# Patient Record
Sex: Female | Born: 1980 | Race: White | Hispanic: No | Marital: Married | Smoking: Current every day smoker
Health system: Southern US, Community
[De-identification: ages and names within clinical notes are randomized; demographics above are authoritative.]

## PROBLEM LIST (undated history)

## (undated) DIAGNOSIS — F419 Anxiety disorder, unspecified: Secondary | ICD-10-CM

## (undated) DIAGNOSIS — F329 Major depressive disorder, single episode, unspecified: Secondary | ICD-10-CM

## (undated) DIAGNOSIS — F32A Depression, unspecified: Secondary | ICD-10-CM

## (undated) DIAGNOSIS — F4000 Agoraphobia, unspecified: Secondary | ICD-10-CM

---

## 2016-06-04 ENCOUNTER — Emergency Department (HOSPITAL_BASED_OUTPATIENT_CLINIC_OR_DEPARTMENT_OTHER): Payer: Self-pay

## 2016-06-04 ENCOUNTER — Emergency Department (HOSPITAL_BASED_OUTPATIENT_CLINIC_OR_DEPARTMENT_OTHER)
Admission: EM | Admit: 2016-06-04 | Discharge: 2016-06-04 | Disposition: A | Discharge status: Home / Self Care | Payer: Self-pay | Attending: Emergency Medicine | Admitting: Emergency Medicine

## 2016-06-04 ENCOUNTER — Encounter (HOSPITAL_BASED_OUTPATIENT_CLINIC_OR_DEPARTMENT_OTHER): Payer: Self-pay | Admitting: Emergency Medicine

## 2016-06-04 DIAGNOSIS — R05 Cough: Secondary | ICD-10-CM | POA: Insufficient documentation

## 2016-06-04 DIAGNOSIS — R350 Frequency of micturition: Secondary | ICD-10-CM | POA: Insufficient documentation

## 2016-06-04 DIAGNOSIS — R1084 Generalized abdominal pain: Secondary | ICD-10-CM

## 2016-06-04 DIAGNOSIS — R197 Diarrhea, unspecified: Secondary | ICD-10-CM | POA: Insufficient documentation

## 2016-06-04 DIAGNOSIS — K429 Umbilical hernia without obstruction or gangrene: Secondary | ICD-10-CM | POA: Insufficient documentation

## 2016-06-04 DIAGNOSIS — F17200 Nicotine dependence, unspecified, uncomplicated: Secondary | ICD-10-CM | POA: Insufficient documentation

## 2016-06-04 DIAGNOSIS — R0781 Pleurodynia: Secondary | ICD-10-CM | POA: Insufficient documentation

## 2016-06-04 HISTORY — DX: Depression, unspecified: F32.A

## 2016-06-04 HISTORY — DX: Major depressive disorder, single episode, unspecified: F32.9

## 2016-06-04 HISTORY — DX: Agoraphobia, unspecified: F40.00

## 2016-06-04 HISTORY — DX: Anxiety disorder, unspecified: F41.9

## 2016-06-04 LAB — CBC WITH DIFFERENTIAL/PLATELET
BASOS ABS: 0.1 10*3/uL (ref 0.0–0.1)
Basophils Relative: 1 %
EOS PCT: 1 %
Eosinophils Absolute: 0.1 10*3/uL (ref 0.0–0.7)
HCT: 42.8 % (ref 36.0–46.0)
Hemoglobin: 14.5 g/dL (ref 12.0–15.0)
LYMPHS PCT: 23 %
Lymphs Abs: 2.3 10*3/uL (ref 0.7–4.0)
MCH: 30.5 pg (ref 26.0–34.0)
MCHC: 33.9 g/dL (ref 30.0–36.0)
MCV: 90.1 fL (ref 78.0–100.0)
Monocytes Absolute: 0.7 10*3/uL (ref 0.1–1.0)
Monocytes Relative: 6 %
NEUTROS ABS: 7.1 10*3/uL (ref 1.7–7.7)
Neutrophils Relative %: 69 %
PLATELETS: 290 10*3/uL (ref 150–400)
RBC: 4.75 MIL/uL (ref 3.87–5.11)
RDW: 14.1 % (ref 11.5–15.5)
WBC: 10.2 10*3/uL (ref 4.0–10.5)

## 2016-06-04 LAB — LIPASE, BLOOD: LIPASE: 12 U/L (ref 11–51)

## 2016-06-04 LAB — COMPREHENSIVE METABOLIC PANEL
ALT: 13 U/L — ABNORMAL LOW (ref 14–54)
AST: 17 U/L (ref 15–41)
Albumin: 4.2 g/dL (ref 3.5–5.0)
Alkaline Phosphatase: 59 U/L (ref 38–126)
Anion gap: 9 (ref 5–15)
BUN: 8 mg/dL (ref 6–20)
CHLORIDE: 106 mmol/L (ref 101–111)
CO2: 23 mmol/L (ref 22–32)
CREATININE: 0.7 mg/dL (ref 0.44–1.00)
Calcium: 8.9 mg/dL (ref 8.9–10.3)
Glucose, Bld: 92 mg/dL (ref 65–99)
POTASSIUM: 3.7 mmol/L (ref 3.5–5.1)
Sodium: 138 mmol/L (ref 135–145)
Total Bilirubin: 0.3 mg/dL (ref 0.3–1.2)
Total Protein: 7.6 g/dL (ref 6.5–8.1)

## 2016-06-04 LAB — PREGNANCY, URINE: PREG TEST UR: NEGATIVE

## 2016-06-04 LAB — URINALYSIS, ROUTINE W REFLEX MICROSCOPIC
BILIRUBIN URINE: NEGATIVE
GLUCOSE, UA: NEGATIVE mg/dL
HGB URINE DIPSTICK: NEGATIVE
KETONES UR: NEGATIVE mg/dL
Leukocytes, UA: NEGATIVE
Nitrite: NEGATIVE
PROTEIN: NEGATIVE mg/dL
Specific Gravity, Urine: 1.022 (ref 1.005–1.030)
pH: 5.5 (ref 5.0–8.0)

## 2016-06-04 MED ORDER — ONDANSETRON 4 MG PO TBDP: 4.0000 mg | ORAL_TABLET | Freq: Three times a day (TID) | ORAL | 0 refills | Status: AC | PRN

## 2016-06-04 MED ORDER — ONDANSETRON HCL 4 MG/2ML IJ SOLN
4.0000 mg | Freq: Once | INTRAMUSCULAR | Status: AC
Start: 2016-06-04 — End: 2016-06-04
  Administered 2016-06-04: 4 mg via INTRAVENOUS
  Filled 2016-06-04: qty 2

## 2016-06-04 MED ORDER — SODIUM CHLORIDE 0.9 % IV BOLUS (SEPSIS)
1000.0000 mL | Freq: Once | INTRAVENOUS | Status: AC
  Administered 2016-06-04: 1000 mL via INTRAVENOUS

## 2016-06-04 MED ORDER — HYDROMORPHONE HCL 1 MG/ML IJ SOLN
0.5000 mg | Freq: Once | INTRAMUSCULAR | Status: AC
  Administered 2016-06-04: 0.5 mg via INTRAVENOUS
  Filled 2016-06-04: qty 1

## 2016-06-04 MED ORDER — IOPAMIDOL (ISOVUE-300) INJECTION 61%
100.0000 mL | Freq: Once | INTRAVENOUS | Status: AC | PRN
  Administered 2016-06-04: 100 mL via INTRAVENOUS

## 2016-06-04 NOTE — ED Notes (Signed)
Patient transported to CT 

## 2016-06-04 NOTE — ED Triage Notes (Signed)
Patient reports that she is having generalized pain  - patient reports that she was dx with gallstone 2 months ago. Patient reports that N/V/D

## 2016-06-04 NOTE — ED Provider Notes (Signed)
[facility]   CSN: [id number] Arrival date & time: 06/04/16  1938   By signing my name below, I, Regina Dyer, attest that this documentation has been prepared under the direction and in the presence of Pricilla Loveless, MD. Electronically Signed: Valentino Dyer, ED Scribe. 06/04/16. 8:21 PM.  History   Chief Complaint Chief Complaint  Patient presents with  . Abdominal Pain   The history is provided by the patient. No language interpreter was used.   HPI Comments: Regina Dyer is a 35 y.o. female with Hx of anxiety and depression, who presents to the Emergency Department complaining of gradually worsening upper abdominal pain onset two months ago. Pt states she was seen this summer for what she believed was a hernia. Per Pt, she was diagnosed with gallstones. She reports associated diarrhea, urine frequency, fatigue, chills, nausea, intermittent emesis occurring almost everyday. She notes her last episode was this afternoon after work. Pt states her upper abdominal pain is worsened after eating. Pt reports taking imodium and Pepto bismol for abdominal pain and diarrhea with minimal relief. She notes she is having episodal diarrhea 2-3 times per day. She also notes that her urine frequency has increased to the point where she is going on herself. Pt also reports upper back pain with bilateral shoulder pain. No alleviating factors noted. She also reports associated bronchitis that began two months ago accompanied with by a constant, mild, cough that has not been resolved. Pt notes she is feeling "like she is dying and her body is giving out". She denies dysuria, hematuria, CP, SOB. No additional complaints at this time. NKDA,    Past Medical History:  Diagnosis Date  . Agoraphobia   . Anxiety   . Depression     There are no active problems to display for this patient.   History reviewed. No pertinent surgical history.  OB History    No data  available       Home Medications    Prior to Admission medications   Medication Sig Start Date End Date Taking? Authorizing Provider  citalopram (CELEXA) 40 MG tablet Take 40 mg by mouth daily.   Yes Historical Provider, MD  clonazePAM (KLONOPIN) 1 MG tablet Take 1 mg by mouth 2 (two) times daily.   Yes Historical Provider, MD  ondansetron (ZOFRAN ODT) 4 MG disintegrating tablet Take 1 tablet (4 mg total) by mouth every 8 (eight) hours as needed for nausea or vomiting. 06/04/16   Pricilla Loveless, MD    Family History History reviewed. No pertinent family history.  Social History Social History  Substance Use Topics  . Smoking status: Current Every Day Smoker  . Smokeless tobacco: Never Used  . Alcohol use No     Allergies   Patient has no known allergies.   Review of Systems Review of Systems  Constitutional: Positive for chills and fatigue.  Respiratory: Positive for cough. Negative for shortness of breath.   Cardiovascular: Negative for chest pain.  Gastrointestinal: Positive for abdominal pain, diarrhea, nausea and vomiting.  Genitourinary: Positive for frequency. Negative for dysuria and hematuria.  Musculoskeletal: Positive for back pain (upper).  All other systems reviewed and are negative.    Physical Exam Updated Vital Signs BP 103/55 (BP Location: Right Arm)   Pulse 76   Temp 98.3 F (36.8 C) (Oral)   Resp 18   Ht 5' (1.524 m)   Wt 164 lb 9.6 oz (74.7 kg)   LMP  (LMP Unknown) Comment: depo  SpO2 98%   BMI 32.15 kg/m   Physical Exam  Constitutional: She is oriented to person, place, and time. She appears well-developed and well-nourished.  HENT:  Head: Normocephalic and atraumatic.  Right Ear: External ear normal.  Left Ear: External ear normal.  Nose: Nose normal.  Eyes: Right eye exhibits no discharge. Left eye exhibits no discharge.  Cardiovascular: Normal rate, regular rhythm and normal heart sounds.   Pulmonary/Chest: Effort normal and  breath sounds normal. She exhibits tenderness (point tnednerness to right lower anterior ribs ).  Abdominal: Soft. There is tenderness.  Soft, mild diffused tenderness. No CVA tenderness.   Neurological: She is alert and oriented to person, place, and time.  Skin: Skin is warm and dry.  Nursing note and vitals reviewed.    ED Treatments / Results   DIAGNOSTIC STUDIES: Oxygen Saturation is 100% on RA, normal by my interpretation.    COORDINATION OF CARE: 8:09 PM Discussed treatment plan with pt at bedside which includes abdomen pelvis with contrast Ct,labs, Zofran and Dilaudid pain and nausea medication and pt agreed to plan.   Labs (all labs ordered are listed, but only abnormal results are displayed) Labs Reviewed  URINALYSIS, ROUTINE W REFLEX MICROSCOPIC - Abnormal; Notable for the following:       Result Value   APPearance CLOUDY (*)    All other components within normal limits  COMPREHENSIVE METABOLIC PANEL - Abnormal; Notable for the following:    ALT 13 (*)    All other components within normal limits  PREGNANCY, URINE  LIPASE, BLOOD  CBC WITH DIFFERENTIAL/PLATELET    EKG  EKG Interpretation None       Radiology Dg Chest 2 View  Result Date: 06/04/2016 CLINICAL DATA:  Cough for 2 months. Recent bronchitis. Possible fever. History of smoking. EXAM: CHEST  2 VIEW COMPARISON:  None. FINDINGS: The heart size and mediastinal contours are within normal limits. Both lungs are clear. The visualized skeletal structures are unremarkable. IMPRESSION: No active cardiopulmonary disease. Electronically Signed   By: Norva PavlovElizabeth  Brown M.D.   On: 06/04/2016 20:42   Ct Abdomen Pelvis W Contrast  Result Date: 06/04/2016 CLINICAL DATA:  Upper abdominal pain/back pain for 2 months with diarrhea. EXAM: CT ABDOMEN AND PELVIS WITH CONTRAST TECHNIQUE: Multidetector CT imaging of the abdomen and pelvis was performed using the standard protocol following bolus administration of intravenous  contrast. CONTRAST:  100mL ISOVUE-300 IOPAMIDOL (ISOVUE-300) INJECTION 61% COMPARISON:  None. FINDINGS: Lower chest: No significant pulmonary nodules or acute consolidative airspace disease. Hepatobiliary: Normal liver with no liver mass. Normal gallbladder with no radiopaque cholelithiasis. No biliary ductal dilatation. Pancreas: Normal, with no mass or duct dilation. Spleen: Normal size. No mass. Adrenals/Urinary Tract: Normal adrenals. Normal kidneys with no hydronephrosis and no renal mass. Normal bladder. Stomach/Bowel: Grossly normal stomach. Normal caliber small bowel with no small bowel wall thickening. Normal appendix . Normal large bowel with no diverticulosis, large bowel wall thickening or pericolonic fat stranding. Vascular/Lymphatic: Normal caliber abdominal aorta. Patent portal, splenic and renal veins. No pathologically enlarged lymph nodes in the abdomen or pelvis. Reproductive: Grossly normal uterus.  No adnexal mass. Other: No pneumoperitoneum, ascites or focal fluid collection. Small to moderate fat containing umbilical hernia with hernia neck measuring 2.0 cm diameter. Minimal fat stranding within and surrounding the umbilical hernia. Musculoskeletal: No aggressive appearing focal osseous lesions. Minimal thoracolumbar spondylosis. IMPRESSION: 1. Small to moderate fat containing umbilical hernia. Minimal fat stranding within and surrounding the umbilical hernia. No fluid collections. 2.  Otherwise no acute abnormality in the abdomen or pelvis. No evidence of bowel obstruction or acute bowel inflammation. Electronically Signed   By: Delbert PhenixJason A Poff M.D.   On: 06/04/2016 22:00    Procedures Procedures (including critical care time)  Medications Ordered in ED Medications  HYDROmorphone (DILAUDID) injection 0.5 mg (0.5 mg Intravenous Given 06/04/16 2026)  ondansetron (ZOFRAN) injection 4 mg (4 mg Intravenous Given 06/04/16 2026)  sodium chloride 0.9 % bolus 1,000 mL (0 mLs Intravenous Stopped  06/04/16 2138)  iopamidol (ISOVUE-300) 61 % injection 100 mL (100 mLs Intravenous Contrast Given 06/04/16 2131)     Initial Impression / Assessment and Plan / ED Course  I have reviewed the triage vital signs and the nursing notes.  Pertinent labs & imaging results that were available during my care of the patient were reviewed by me and considered in my medical decision making (see chart for details).  Clinical Course as of Jun 04 2332  Sat Jun 04, 2016  2010 Will get labs, CT (diffuse pain/tenderness, non-focal) and IV pain/nausea meds. This is probably less likely gallbladder as her pain is multiple and diffuse. No u/s at this facility at this time  [SG]    Clinical Course User Index [SG] Pricilla LovelessScott Tahesha Skeet, MD    Patient with multiple complaints, no obvious etiology. Labs are unremarkable including hemoglobin and LFTs. Not pregnant. CT shows no acute pathology, does show a small umbilical hernia that does not appear to have bowel or signs of incarceration. No vomiting in the ED. At this point I do not find any emergent findings it would warrant further treatment or care in the ED. No need for an emergent surgical consult. Follow-up with surgery as an outpatient for elective hernia repair. No gallstones seen on CT although this is not the ideal test but my suspicion for cholecystitis is quite low. Discussed return precautions  Final Clinical Impressions(s) / ED Diagnoses   Final diagnoses:  Generalized abdominal pain  Umbilical hernia without obstruction and without gangrene    New Prescriptions Discharge Medication List as of 06/04/2016 11:06 PM    START taking these medications   Details  ondansetron (ZOFRAN ODT) 4 MG disintegrating tablet Take 1 tablet (4 mg total) by mouth every 8 (eight) hours as needed for nausea or vomiting., Starting Sat 06/04/2016, Print        I personally performed the services described in this documentation, which was scribed in my presence. The recorded  information has been reviewed and is accurate.     Pricilla LovelessScott Ellaree Gear, MD 06/04/16 [phone]

## 2017-06-16 IMAGING — CT CT ABD-PELV W/ CM
2 of 4 series · 16 of 46 positions shown, 18 images · IV contrast (iopamidol)
Comparison: None.

CLINICAL DATA: Upper abdominal pain/back pain for 2 months with
diarrhea.

EXAM:
CT ABDOMEN AND PELVIS WITH CONTRAST
TECHNIQUE: Multidetector CT imaging of the abdomen and pelvis was performed
using the standard protocol following bolus administration of
intravenous contrast.
CONTRAST:  100mL [id number] IOPAMIDOL (MD4ERU-YWW) INJECTION 61%

[Series 2: axial st · axial · 0.98mm/px · z∈[-443,-18]mm · 13 of 93 slices shown, 15 images]
[im 4/93  soft-tissue]
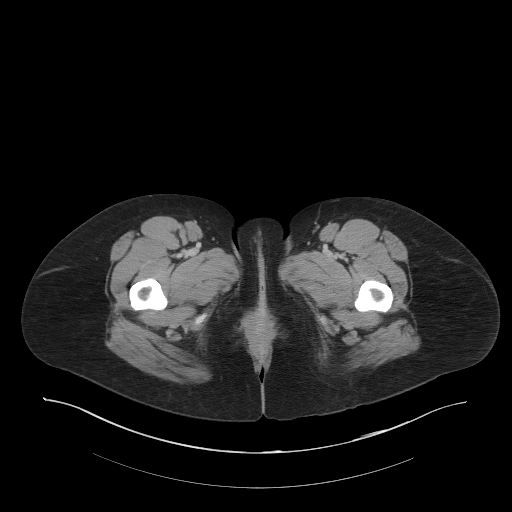
[im 4/93  bone]
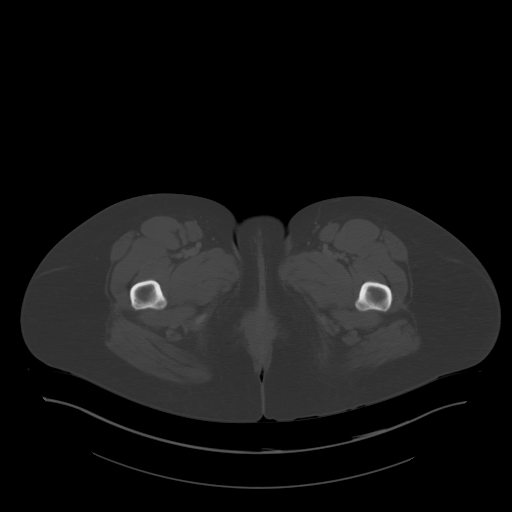
[im 12/93  soft-tissue]
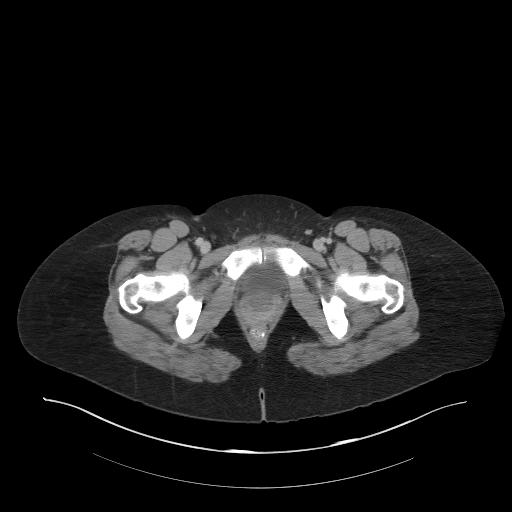
[im 20/93  soft-tissue]
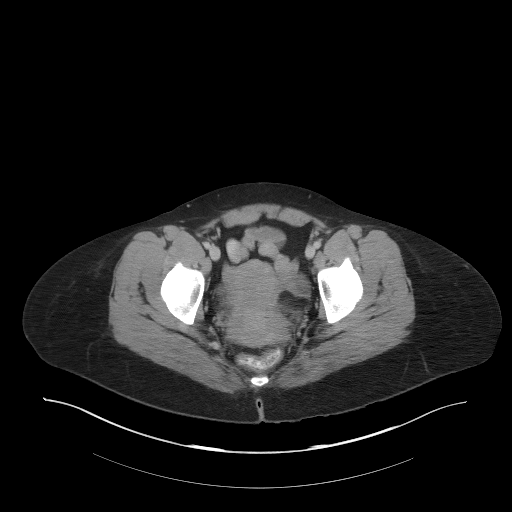
[im 27/93  soft-tissue]
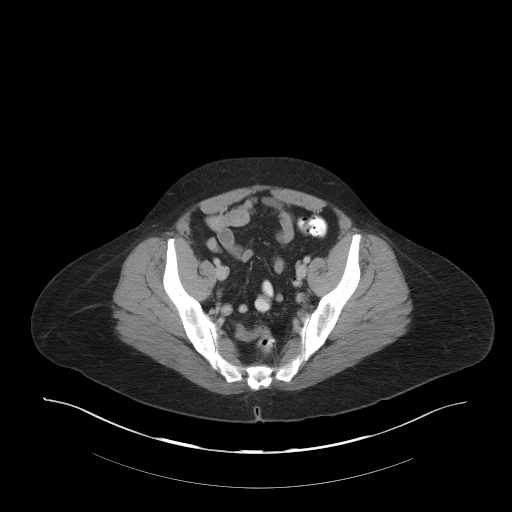
[im 31/93  soft-tissue]
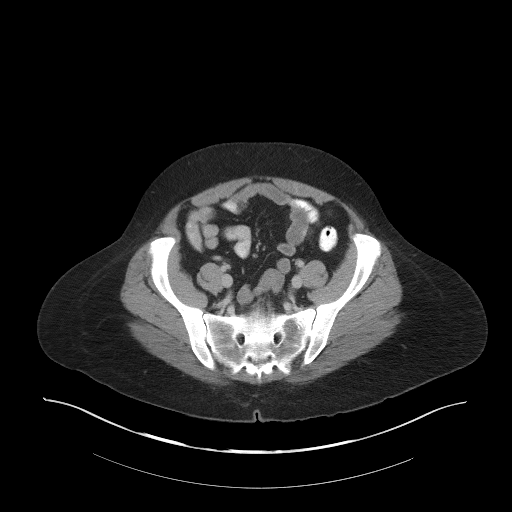
[im 39/93  soft-tissue]
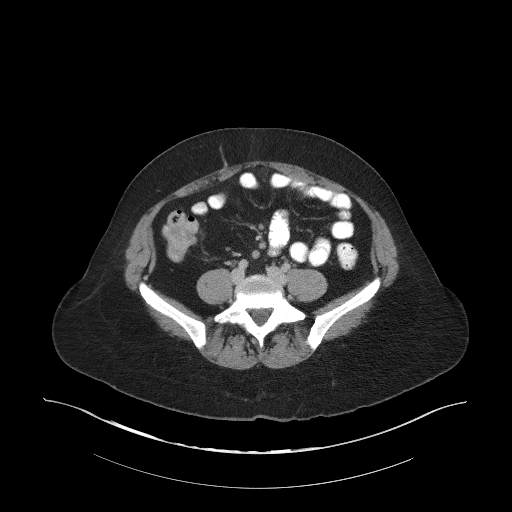
[im 47/93  soft-tissue]
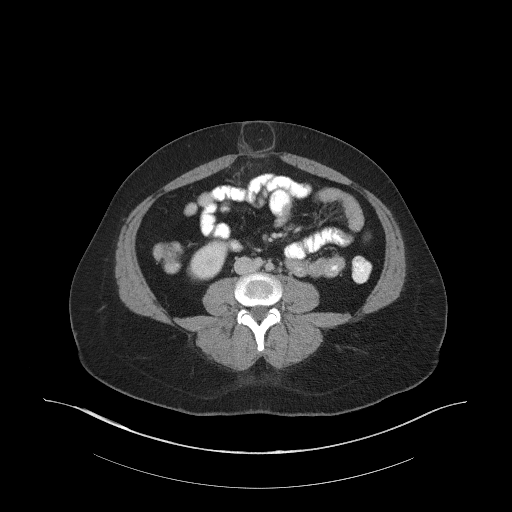
[im 54/93  soft-tissue]
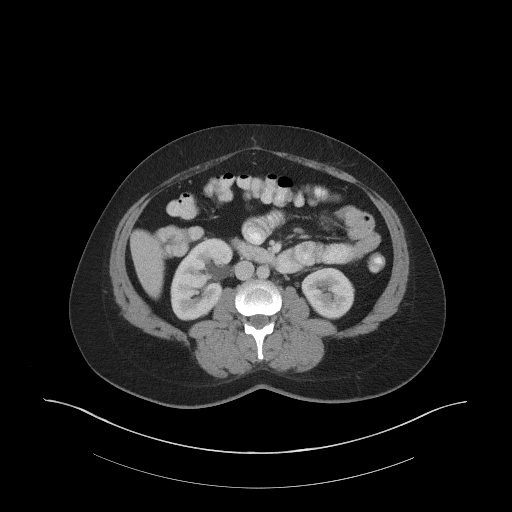
[im 62/93  soft-tissue]
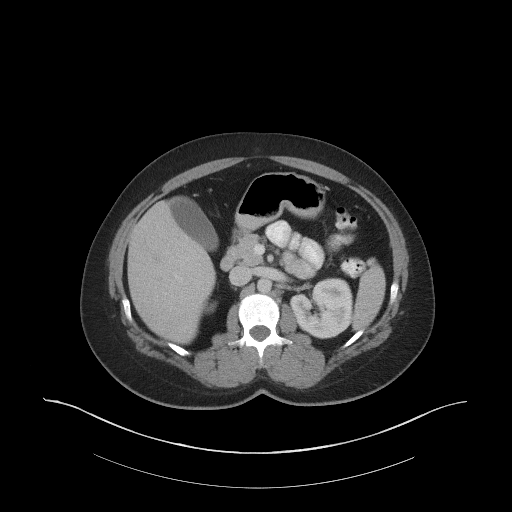
[im 62/93  bone]
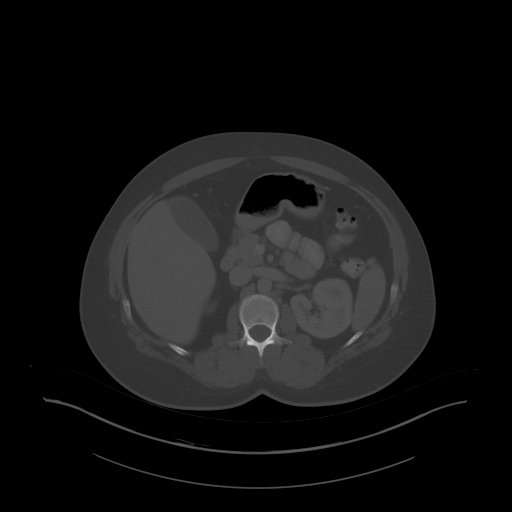
[im 66/93  soft-tissue]
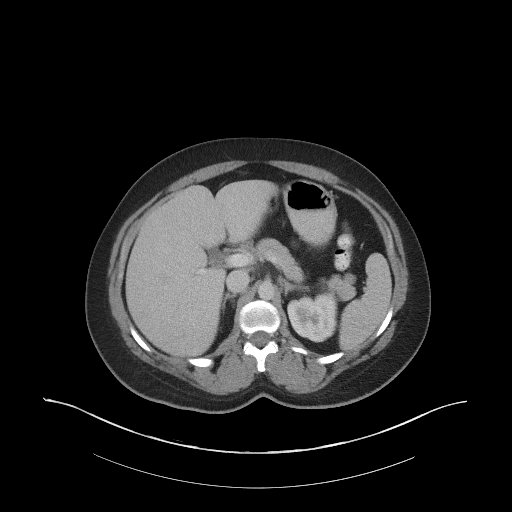
[im 73/93  soft-tissue]
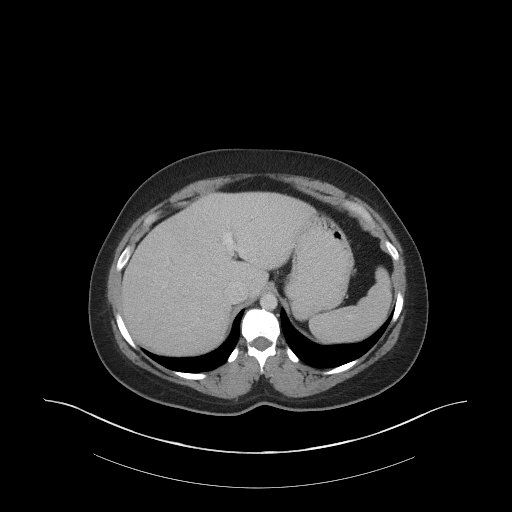
[im 81/93  soft-tissue]
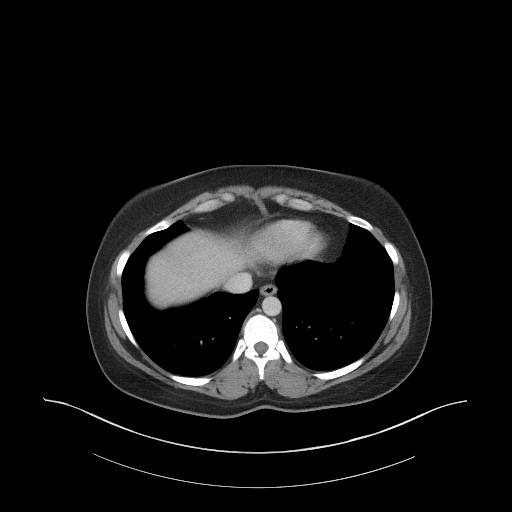
[im 89/93  soft-tissue]
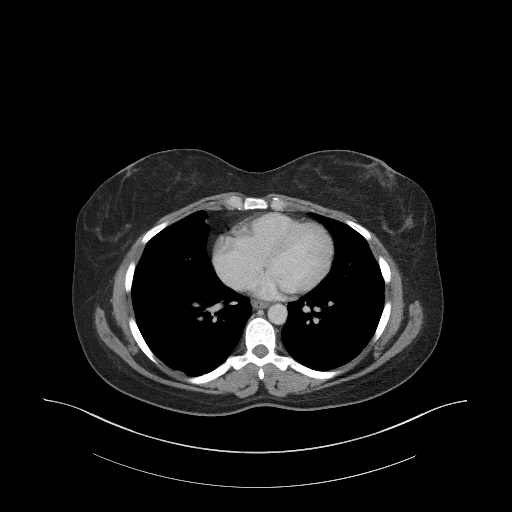

[Series 5: coronal st · coronal · 0.88mm/px · 3 of 104 slices shown]
[im 35/104  soft-tissue]
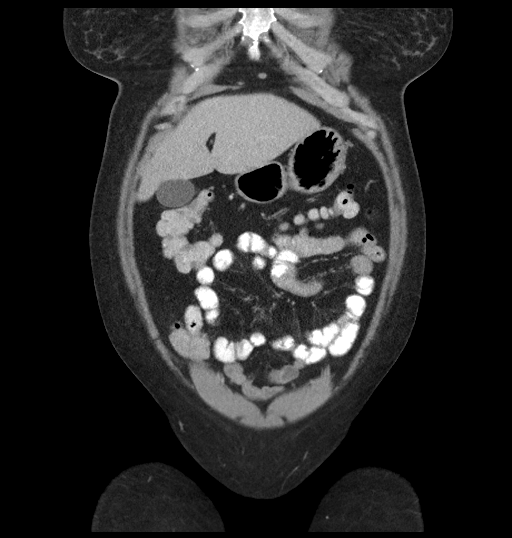
[im 46/104  soft-tissue]
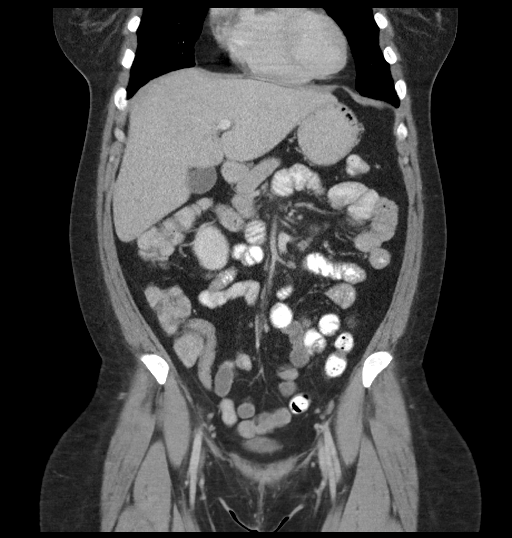
[im 58/104  soft-tissue]
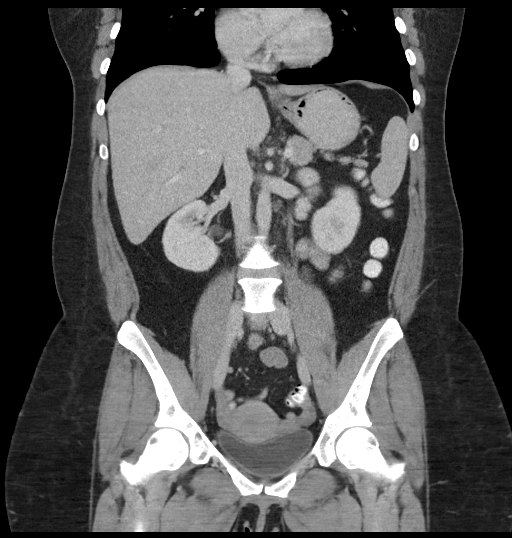

[16 of 46 positions shown; findings below may reference images not displayed]

FINDINGS: Lower chest: No significant pulmonary nodules or acute consolidative
airspace disease.

Hepatobiliary: Normal liver with no liver mass. Normal gallbladder
with no radiopaque cholelithiasis. No biliary ductal dilatation.

Pancreas: Normal, with no mass or duct dilation.

Spleen: Normal size. No mass.

Adrenals/Urinary Tract: Normal adrenals. Normal kidneys with no
hydronephrosis and no renal mass. Normal bladder.

Stomach/Bowel: Grossly normal stomach. Normal caliber small bowel
with no small bowel wall thickening. Normal appendix . Normal large
bowel with no diverticulosis, large bowel wall thickening or
pericolonic fat stranding.

Vascular/Lymphatic: Normal caliber abdominal aorta. Patent portal,
splenic and renal veins. No pathologically enlarged lymph nodes in
the abdomen or pelvis.

Reproductive: Grossly normal uterus.  No adnexal mass.

Other: No pneumoperitoneum, ascites or focal fluid collection. Small
to moderate fat containing umbilical hernia with hernia neck
measuring 2.0 cm diameter. Minimal fat stranding within and
surrounding the umbilical hernia.

Musculoskeletal: No aggressive appearing focal osseous lesions.
Minimal thoracolumbar spondylosis.
IMPRESSION: 1. Small to moderate fat containing umbilical hernia. Minimal fat
stranding within and surrounding the umbilical hernia. No fluid
collections.
2. Otherwise no acute abnormality in the abdomen or pelvis. No
evidence of bowel obstruction or acute bowel inflammation.
